# Patient Record
Sex: Female | Born: 1965 | Hispanic: Yes | Marital: Single | Smoking: Never smoker
Health system: Southern US, Community
[De-identification: ages and names within clinical notes are randomized; demographics above are authoritative.]

## PROBLEM LIST (undated history)

## (undated) HISTORY — PX: CHOLECYSTECTOMY: SHX55

---

## 2016-03-10 ENCOUNTER — Encounter: Payer: Self-pay | Admitting: Emergency Medicine

## 2016-03-10 ENCOUNTER — Emergency Department: Payer: Self-pay

## 2016-03-10 ENCOUNTER — Emergency Department
Admission: EM | Admit: 2016-03-10 | Discharge: 2016-03-10 | Disposition: A | Discharge status: Home / Self Care | Payer: Self-pay | Attending: Student in an Organized Health Care Education/Training Program | Admitting: Student in an Organized Health Care Education/Training Program

## 2016-03-10 DIAGNOSIS — R52 Pain, unspecified: Secondary | ICD-10-CM

## 2016-03-10 DIAGNOSIS — R609 Edema, unspecified: Secondary | ICD-10-CM

## 2016-03-10 DIAGNOSIS — M7122 Synovial cyst of popliteal space [Baker], left knee: Secondary | ICD-10-CM | POA: Insufficient documentation

## 2016-03-10 LAB — COMPREHENSIVE METABOLIC PANEL
ALK PHOS: 87 U/L (ref 38–126)
ALT: 19 U/L (ref 14–54)
ANION GAP: 8 (ref 5–15)
AST: 26 U/L (ref 15–41)
Albumin: 4 g/dL (ref 3.5–5.0)
BILIRUBIN TOTAL: 0.2 mg/dL — AB (ref 0.3–1.2)
BUN: 13 mg/dL (ref 6–20)
CALCIUM: 9.3 mg/dL (ref 8.9–10.3)
CO2: 28 mmol/L (ref 22–32)
Chloride: 104 mmol/L (ref 101–111)
Creatinine, Ser: 0.37 mg/dL — ABNORMAL LOW (ref 0.44–1.00)
GFR calc non Af Amer: >60 mL/min (ref >60)
GLUCOSE: 97 mg/dL (ref 65–99)
Potassium: 3.9 mmol/L (ref 3.5–5.1)
Sodium: 140 mmol/L (ref 135–145)
TOTAL PROTEIN: 8.2 g/dL — AB (ref 6.5–8.1)

## 2016-03-10 LAB — CBC WITH DIFFERENTIAL/PLATELET
BASOS ABS: 0.1 10*3/uL (ref 0–0.1)
BASOS PCT: 1 %
EOS ABS: 0.1 10*3/uL (ref 0–0.7)
Eosinophils Relative: 1 %
HEMATOCRIT: 40.3 % (ref 35.0–47.0)
HEMOGLOBIN: 13.5 g/dL (ref 12.0–16.0)
Lymphocytes Relative: 26 %
Lymphs Abs: 2.6 10*3/uL (ref 1.0–3.6)
MCH: 28 pg (ref 26.0–34.0)
MCHC: 33.5 g/dL (ref 32.0–36.0)
MCV: 83.5 fL (ref 80.0–100.0)
MONO ABS: 0.9 10*3/uL (ref 0.2–0.9)
Monocytes Relative: 9 %
NEUTROS ABS: 6.2 10*3/uL (ref 1.4–6.5)
NEUTROS PCT: 63 %
Platelets: 269 10*3/uL (ref 150–440)
RBC: 4.82 MIL/uL (ref 3.80–5.20)
RDW: 14 % (ref 11.5–14.5)
WBC: 9.8 10*3/uL (ref 3.6–11.0)

## 2016-03-10 MED ORDER — NAPROXEN 500 MG PO TABS: 500.0000 mg | ORAL_TABLET | Freq: Two times a day (BID) | ORAL | 0 refills | Status: AC

## 2016-03-10 NOTE — ED Triage Notes (Addendum)
Pt c/o left leg pain for 20 days. Denies injury. Swelling noted to lateral calf. Has been swollen this long as well. Denies SHOB, CP or any other sx. No medical hx.

## 2016-03-10 NOTE — ED Notes (Signed)
Interpreter requested 

## 2016-03-10 NOTE — ED Provider Notes (Signed)
[facility] Emergency Department Provider Note   ____________________________________________   None    (approximate)  I have reviewed the triage vital signs and the nursing notes.   HISTORY  Chief Complaint Leg Pain Spanish interpreter was used.  HPI Julie Friedman is a 50 y.o. female is here with complaint of left leg pain.Patient states that her leg is been hurting for 20 days. She denies any injury or recent travel. She states the swelling has been to her lateral calf area. She denies any shortness of breath, chest pain fever or chills. Patient does not have a PCP. She denies any health problems. Patient continued use to be ambulatory. She rates her pain as an 8 out of 10. Family is for just concern is that she possibly could have a blood clot in her leg.   History reviewed. No pertinent past medical history.  There are no active problems to display for this patient.   Past Surgical History:  Procedure Laterality Date  . CHOLECYSTECTOMY      Prior to Admission medications   Medication Sig Start Date End Date Taking? Authorizing Provider  naproxen (NAPROSYN) 500 MG tablet Take 1 tablet (500 mg total) by mouth 2 (two) times daily with a meal. 03/10/16   Tommi Rumpshonda L Kraig Genis, PA-C    Allergies Review of patient's allergies indicates no known allergies.  No family history on file.  Social History Social History  Substance Use Topics  . Smoking status: Never Smoker  . Smokeless tobacco: Not on file  . Alcohol use No    Review of Systems Constitutional: No fever/chills Cardiovascular: Denies chest pain. Respiratory: Denies shortness of breath. Gastrointestinal: No abdominal pain.  No nausea, no vomiting.  Genitourinary: Negative for dysuria. Musculoskeletal: Negative for back pain.Positive for leg pain. Skin: Negative for rash. Neurological: Negative for headaches, focal weakness or numbness.  10-point ROS otherwise  negative.  ____________________________________________   PHYSICAL EXAM:  VITAL SIGNS: ED Triage Vitals [03/10/16 1807]  Enc Vitals Group     BP      Pulse      Resp      Temp      Temp src      SpO2      Weight 203 lb (92.1 kg)     Height 5' 1.02" (1.55 m)     Head Circumference      Peak Flow      Pain Score 8     Pain Loc      Pain Edu?      Excl. in GC?     Constitutional: Alert and oriented. Well appearing and in no acute distress. Eyes: Conjunctivae are normal. PERRL. EOMI. Head: Atraumatic. Nose: No congestion/rhinnorhea. Neck: No stridor.   Cardiovascular: Normal rate, regular rhythm. Grossly normal heart sounds.  Good peripheral circulation. Respiratory: Normal respiratory effort.  No retractions. Lungs CTAB. Gastrointestinal: Soft and nontender. No distention. Musculoskeletal:On examination of the left leg there is some moderate tenderness on palpation of the posterior aspect of the knee. There appears to be some tenderness and mild soft tissue edema present on the lateral aspect of the left leg. Range of motion is minimally restricted secondary to discomfort. There is no erythema or warmth to touch. Neurologic:  Normal speech and language. No gross focal neurologic deficits are appreciated. No gait instability. Skin:  Skin is warm, dry.  Patient has a superficial abrasion to the left lateral aspect of her left leg which is due to a shaving  accidentt. There is no erythema or drainage from the site. Psychiatric: Mood and affect are normal. Speech and behavior are normal.  ____________________________________________   LABS (all labs ordered are listed, but only abnormal results are displayed)  Labs Reviewed  COMPREHENSIVE METABOLIC PANEL - Abnormal; Notable for the following:       Result Value   Creatinine, Ser 0.37 (*)    Total Protein 8.2 (*)    Total Bilirubin 0.2 (*)    All other components within normal limits  CBC WITH DIFFERENTIAL/PLATELET      RADIOLOGY  Ultrasound left leg per radiologist.  IMPRESSION: No evidence of left lower extremity deep venous thrombosis. Left popliteal fossa Baker's cyst present.  ____________________________________________   PROCEDURES  Procedure(s) performed: None  Procedures  Critical Care performed: No  ____________________________________________   INITIAL IMPRESSION / ASSESSMENT AND PLAN / ED COURSE  Pertinent labs & imaging results that were available during my care of the patient were reviewed by me and considered in my medical decision making (see chart for details).    Clinical Course   With the interpreter presently discussed Baker's cyst and what this would entail. Because she is symptomatic she is being sent to orthopedic surgeon for further evaluation. Patient is given prescription for naproxen 500 mg twice a day with food. She is to call tomorrow to make an appointment.  ____________________________________________   FINAL CLINICAL IMPRESSION(S) / ED DIAGNOSES  Final diagnoses:  Swelling  Pain  Baker's cyst of knee, left      NEW MEDICATIONS STARTED DURING THIS VISIT:  Discharge Medication List as of 03/10/2016  8:46 PM    START taking these medications   Details  naproxen (NAPROSYN) 500 MG tablet Take 1 tablet (500 mg total) by mouth 2 (two) times daily with a meal., Starting Wed 03/10/2016, Print         Note:  This document was prepared using Dragon voice recognition software and may include unintentional dictation errors.    Tommi RumpsRhonda L Liani Caris, PA-C 03/10/16 2125    Willy EddyPatrick Robinson, MD 03/10/16 [phone]

## 2017-02-11 IMAGING — US US EXTREM LOW VENOUS*L*
1 series · 13 of 24 positions shown · non-contrast
Comparison: None.

CLINICAL DATA: Left lower extremity pain and edema.



[Series 1: us extrem low venous*left* · 0.08mm/px · 13 of 40 slices shown]
[im 1/40]
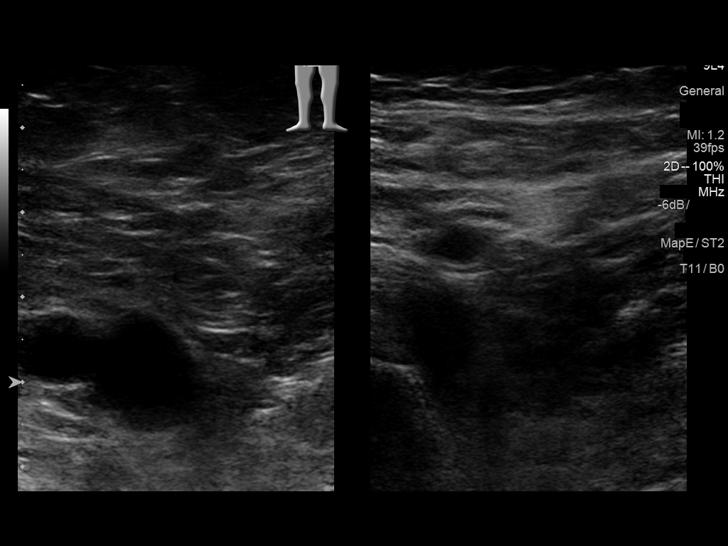
[im 4/40]
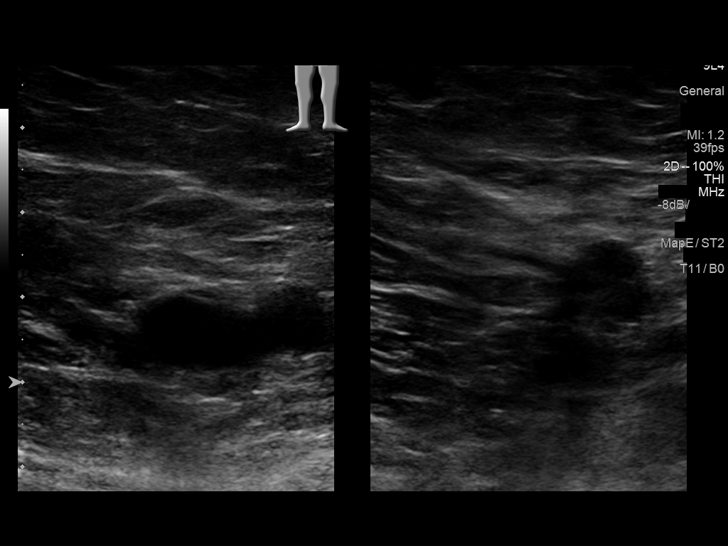
[im 7/40]
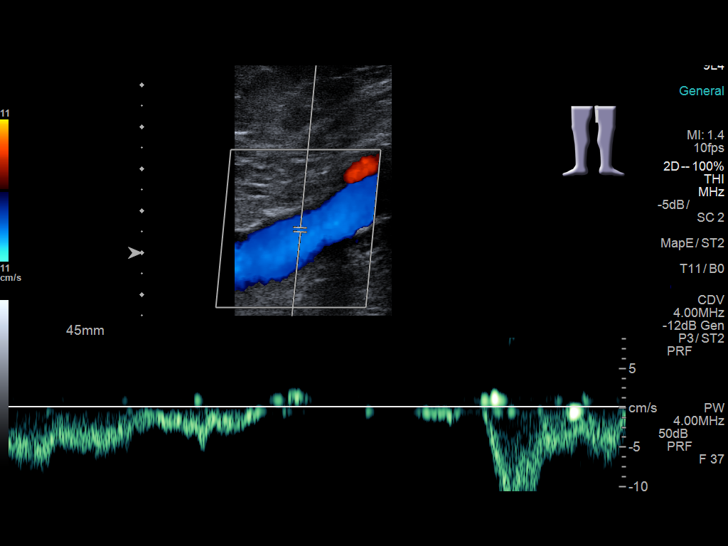
[im 11/40]
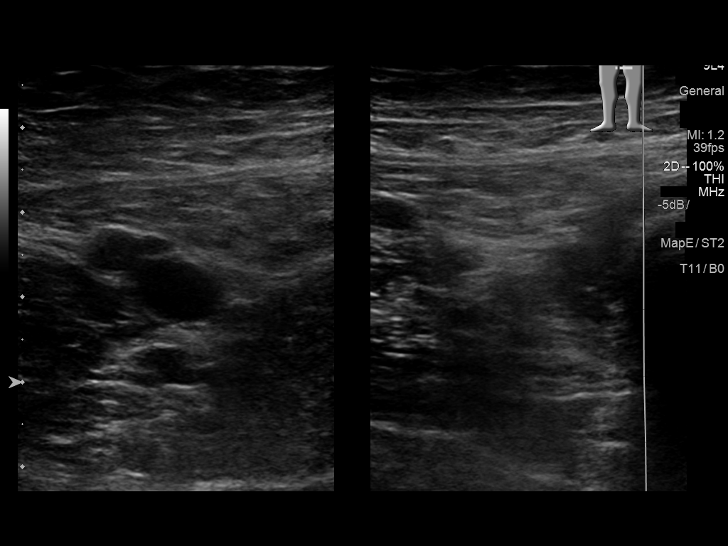
[im 14/40]
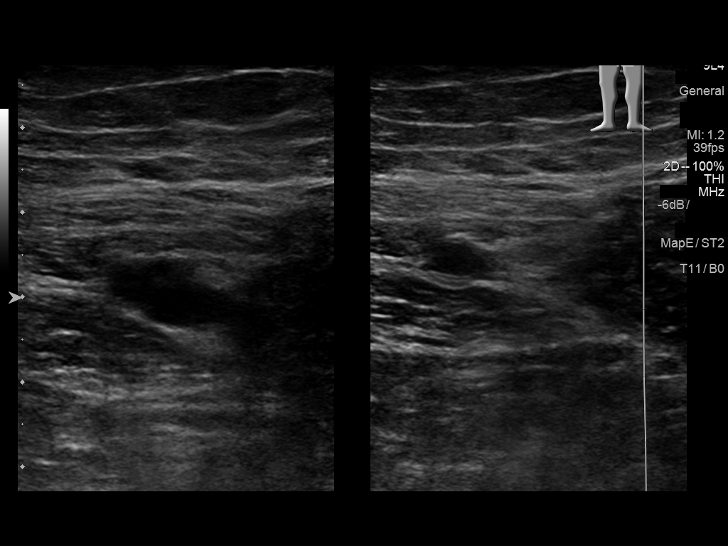
[im 17/40]
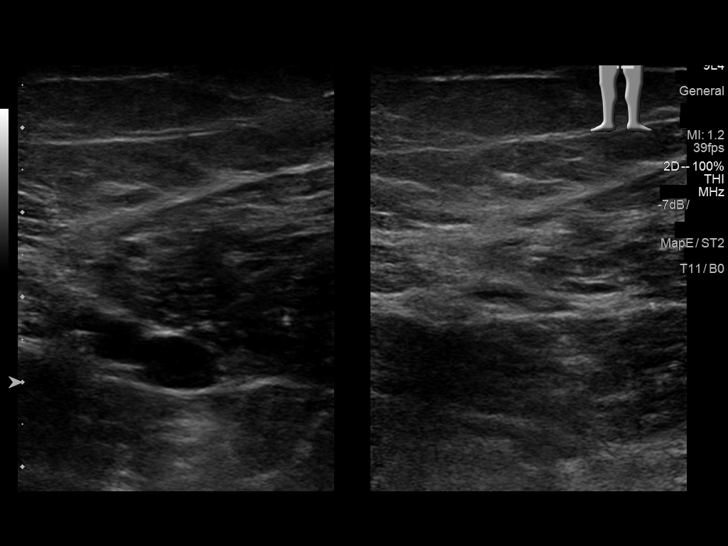
[im 21/40]
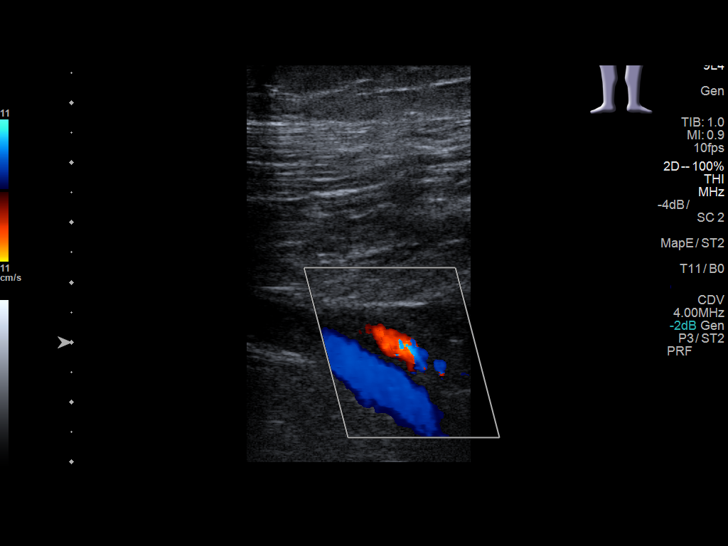
[im 23/40]
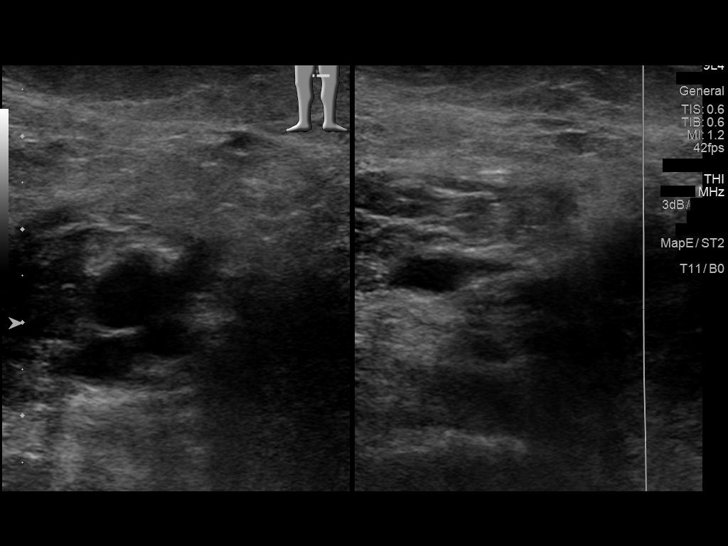
[im 26/40]
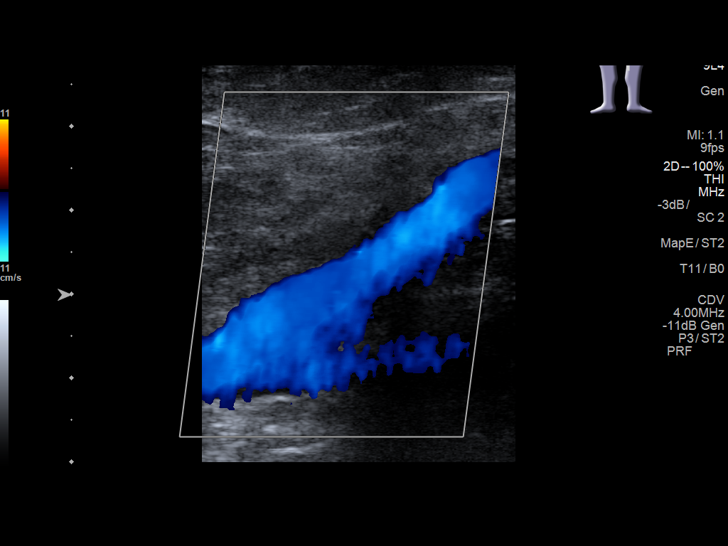
[im 29/40]
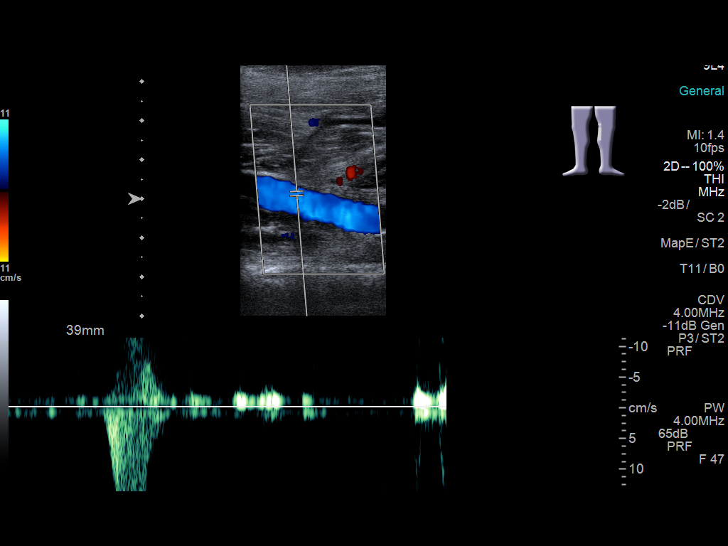
[im 33/40]
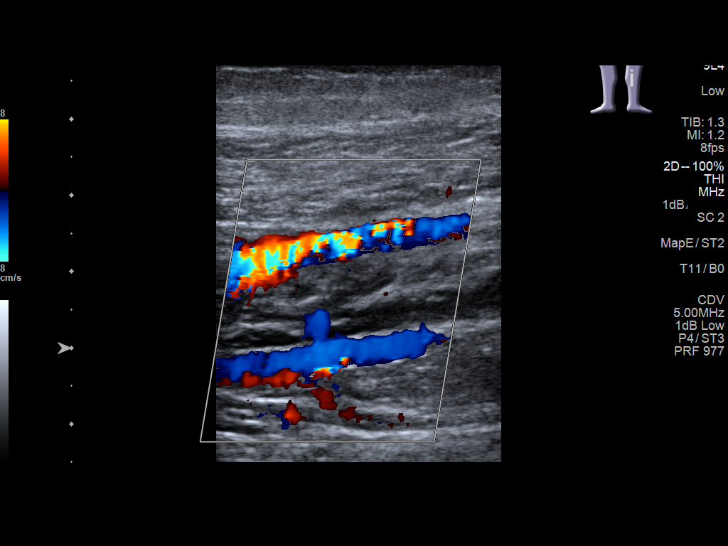
[im 36/40]
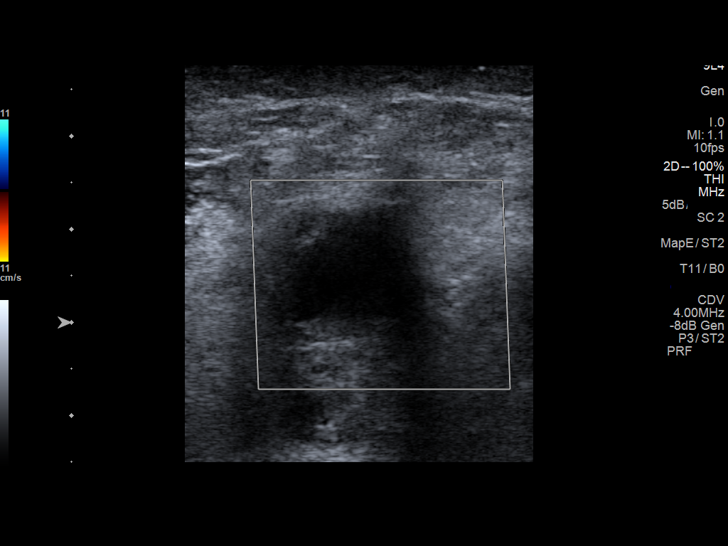
[im 40/40]
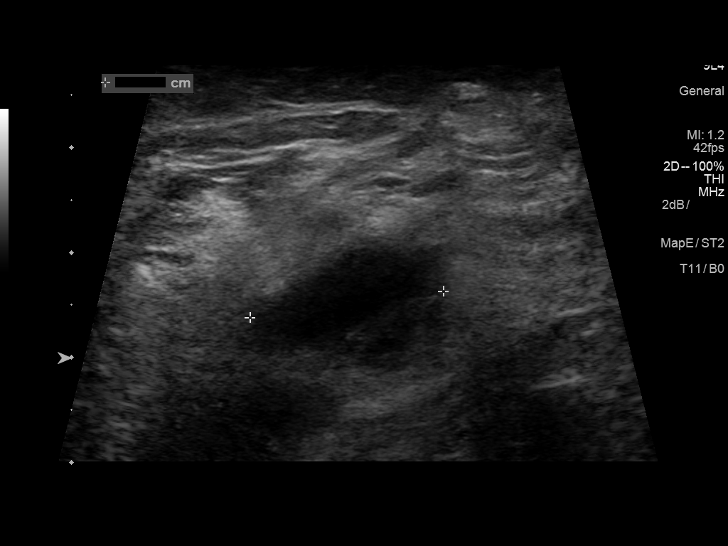

[13 of 24 positions shown; findings below may reference images not displayed]

FINDINGS: Contralateral Common Femoral Vein: Respiratory phasicity is normal
and symmetric with the symptomatic side. No evidence of thrombus.
Normal compressibility.

Common Femoral Vein: No evidence of thrombus. Normal
compressibility, respiratory phasicity and response to augmentation.

Saphenofemoral Junction: No evidence of thrombus. Normal
compressibility and flow on color Doppler imaging.

Profunda Femoral Vein: No evidence of thrombus. Normal
compressibility and flow on color Doppler imaging.

Femoral Vein: No evidence of thrombus. Normal compressibility,
respiratory phasicity and response to augmentation.

Popliteal Vein: No evidence of thrombus. Normal compressibility,
respiratory phasicity and response to augmentation.

Calf Veins: No evidence of thrombus. Normal compressibility and flow
on color Doppler imaging.

Superficial Great Saphenous Vein: No evidence of thrombus. Normal
compressibility and flow on color Doppler imaging.

Venous Reflux:  None.

Other Findings: No evidence of superficial. Anechoic fluid
collection in the left popliteal fossa measures approximately 1 cm
in thickness and up to 3.4 cm in length. Findings are consistent
with a Baker's cyst.
IMPRESSION: No evidence of left lower extremity deep venous thrombosis. Left
popliteal fossa Baker's cyst present.

## 2017-06-15 ENCOUNTER — Ambulatory Visit: Payer: Self-pay

## 2017-08-17 ENCOUNTER — Ambulatory Visit: Payer: Self-pay | Attending: Oncology
